# Patient Record
Sex: Male | Born: 1990 | Race: Black or African American | Hispanic: No | Marital: Single
Health system: Southern US, Community
[De-identification: ages and names within clinical notes are randomized; demographics above are authoritative.]

## PROBLEM LIST (undated history)

## (undated) DIAGNOSIS — R011 Cardiac murmur, unspecified: Secondary | ICD-10-CM

## (undated) DIAGNOSIS — K259 Gastric ulcer, unspecified as acute or chronic, without hemorrhage or perforation: Secondary | ICD-10-CM

## (undated) DIAGNOSIS — I519 Heart disease, unspecified: Secondary | ICD-10-CM

---
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
## 2011-06-06 IMAGING — CR DG CHEST 1V PORT
1 series · 1 of 1 positions shown · non-contrast
Comparison: 10/04/2010

CLINICAL DATA: Follow-up pneumothorax

PORTABLE CHEST - 1 VIEW

[AP]
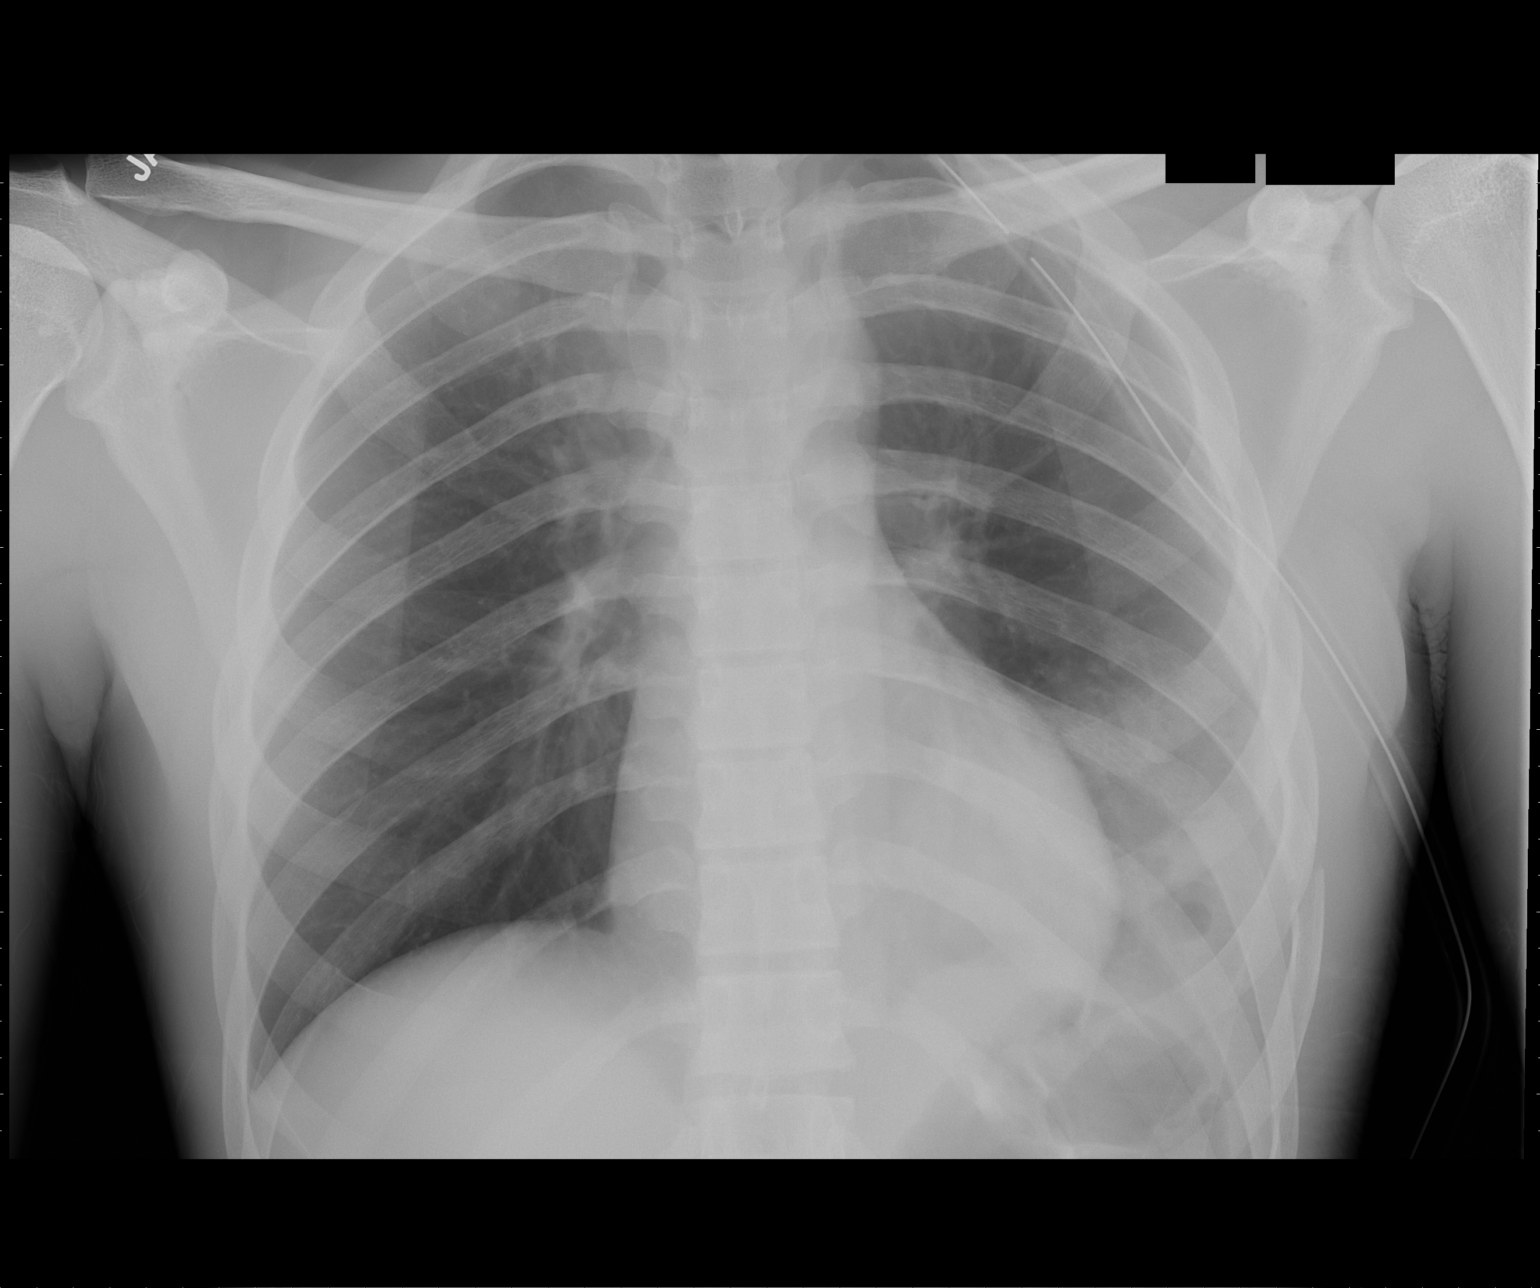

[1 of 1 positions shown; findings below may reference images not displayed]

FINDINGS: Left-sided chest tube remains in place and there is
slight interval enlargement of the pneumothorax now with 7 mm of
pleuroparenchymal displacement.  Persistent consolidation is seen
at the left lung base with developing lucencies suggesting
hematoceles.  The right lung is clear.  The heart is normal in
size.  Displaced left rib fractures are again seen.
IMPRESSION: Slight enlargement of the left-sided pneumothorax with chest tube
in place.  No other interval change.

## 2022-02-08 ENCOUNTER — Encounter (HOSPITAL_COMMUNITY): Payer: Self-pay

## 2022-02-08 ENCOUNTER — Other Ambulatory Visit: Payer: Self-pay

## 2022-02-08 ENCOUNTER — Emergency Department (HOSPITAL_COMMUNITY)
Admission: EM | Admit: 2022-02-08 | Discharge: 2022-02-08 | Disposition: A | Discharge status: Home / Self Care | Attending: Emergency Medicine | Admitting: Emergency Medicine

## 2022-02-08 DIAGNOSIS — R1013 Epigastric pain: Secondary | ICD-10-CM | POA: Diagnosis present

## 2022-02-08 DIAGNOSIS — K920 Hematemesis: Secondary | ICD-10-CM | POA: Diagnosis not present

## 2022-02-08 DIAGNOSIS — Z8711 Personal history of peptic ulcer disease: Secondary | ICD-10-CM

## 2022-02-08 HISTORY — DX: Cardiac murmur, unspecified: R01.1

## 2022-02-08 HISTORY — DX: Gastric ulcer, unspecified as acute or chronic, without hemorrhage or perforation: K25.9

## 2022-02-08 HISTORY — DX: Heart disease, unspecified: I51.9

## 2022-02-08 LAB — COMPREHENSIVE METABOLIC PANEL
ALT: 50 U/L — ABNORMAL HIGH (ref 0–44)
AST: 28 U/L (ref 15–41)
Albumin: 4.5 g/dL (ref 3.5–5.0)
Alkaline Phosphatase: 69 U/L (ref 38–126)
Anion gap: 5 (ref 5–15)
BUN: 11 mg/dL (ref 6–20)
CO2: 27 mmol/L (ref 22–32)
Calcium: 9.5 mg/dL (ref 8.9–10.3)
Chloride: 105 mmol/L (ref 98–111)
Creatinine, Ser: 1.09 mg/dL (ref 0.61–1.24)
GFR, Estimated: >60 mL/min (ref >60)
Glucose, Bld: 105 mg/dL — ABNORMAL HIGH (ref 70–99)
Potassium: 4.6 mmol/L (ref 3.5–5.1)
Sodium: 137 mmol/L (ref 135–145)
Total Bilirubin: 0.6 mg/dL (ref 0.3–1.2)
Total Protein: 8.2 g/dL — ABNORMAL HIGH (ref 6.5–8.1)

## 2022-02-08 LAB — CBC
HCT: 48.9 % (ref 39.0–52.0)
Hemoglobin: 15.7 g/dL (ref 13.0–17.0)
MCH: 25.9 pg — ABNORMAL LOW (ref 26.0–34.0)
MCHC: 32.1 g/dL (ref 30.0–36.0)
MCV: 80.7 fL (ref 80.0–100.0)
Platelets: 194 10*3/uL (ref 150–400)
RBC: 6.06 MIL/uL — ABNORMAL HIGH (ref 4.22–5.81)
RDW: 13.5 % (ref 11.5–15.5)
WBC: 5.1 10*3/uL (ref 4.0–10.5)
nRBC: 0 % (ref 0.0–0.2)

## 2022-02-08 LAB — LIPASE, BLOOD: Lipase: 20 U/L (ref 11–51)

## 2022-02-08 MED ORDER — ONDANSETRON 8 MG PO TBDP: ORAL_TABLET | ORAL | 0 refills | Status: AC

## 2022-02-08 MED ORDER — PANTOPRAZOLE SODIUM 40 MG PO TBEC
40.0000 mg | DELAYED_RELEASE_TABLET | Freq: Once | ORAL | Status: AC
  Administered 2022-02-08: 40 mg via ORAL
  Filled 2022-02-08: qty 1

## 2022-02-08 MED ORDER — ONDANSETRON 4 MG PO TBDP
4.0000 mg | ORAL_TABLET | Freq: Once | ORAL | Status: AC | PRN
Start: 2022-02-08 — End: 2022-02-08
  Administered 2022-02-08: 4 mg via ORAL
  Filled 2022-02-08: qty 1

## 2022-02-08 MED ORDER — SUCRALFATE 1 G PO TABS
1.0000 g | ORAL_TABLET | Freq: Three times a day (TID) | ORAL | Status: DC
  Administered 2022-02-08: 1 g via ORAL
  Filled 2022-02-08: qty 1

## 2022-02-08 MED ORDER — SUCRALFATE 1 G PO TABS: 1.0000 g | ORAL_TABLET | Freq: Three times a day (TID) | ORAL | 1 refills | Status: AC

## 2022-02-08 MED ORDER — PANTOPRAZOLE SODIUM 20 MG PO TBEC: 20.0000 mg | DELAYED_RELEASE_TABLET | Freq: Two times a day (BID) | ORAL | 1 refills | Status: AC

## 2022-02-08 MED ORDER — ONDANSETRON 8 MG PO TBDP
8.0000 mg | ORAL_TABLET | Freq: Once | ORAL | Status: AC
  Administered 2022-02-08: 8 mg via ORAL
  Filled 2022-02-08: qty 1

## 2022-02-08 NOTE — Discharge Instructions (Signed)
Begin taking Carafate and Protonix as prescribed.  Begin taking Zofran as prescribed as needed for nausea.  Clear liquid diet for the next 12 hours, then slowly advance to normal as tolerated.  Return to the emergency department if you develop severe abdominal pain, high fevers, black or bloody stools, or for other new and concerning symptoms.

## 2022-02-08 NOTE — ED Triage Notes (Signed)
Pt presents with epigastric abd pain, N/V. Pt reports streaks of blood in his emesis. Pt states he has a hx of stomach ulcers.

## 2022-02-08 NOTE — ED Provider Notes (Signed)
[facility] EMERGENCY DEPARTMENT Provider Note   CSN: [id number] Arrival date & time: 02/08/22  2101     History  Chief Complaint  Patient presents with   Abdominal Pain    Thomas Lin is a 31 y.o. male.  Patient is a 31 year old male with past medical history of peptic ulcers off medications for the past 6 months since being incarcerated.  Patient presenting today with complaints of upper abdominal pain, nausea, and several episodes of vomiting since yesterday evening.  This evening when he vomited, there was a small amount of blood in the emesis.  He denies having any fevers or chills.  He denies any diarrhea, constipation, or bloody or black stools.  He denies any ill contacts or having consumed any undercooked or suspicious foods.  The history is provided by the patient.  Abdominal Pain Pain location:  Epigastric Pain quality: cramping   Pain radiates to:  Does not radiate Pain severity:  Moderate Onset quality:  Gradual Duration:  2 days Timing:  Constant Progression:  Worsening Chronicity:  New Relieved by:  Nothing Worsened by:  Nothing Ineffective treatments:  None tried     Home Medications Prior to Admission medications   Not on File      Allergies    Amoxicillin and Penicillins    Review of Systems   Review of Systems  Gastrointestinal:  Positive for abdominal pain.  All other systems reviewed and are negative.  Physical Exam Updated Vital Signs BP 116/70   Pulse (!) 53   Temp 97.7 F (36.5 C) (Oral)   Resp 18   Ht 5\' 11"  (1.803 m)   Wt 85.7 kg   SpO2 97%   BMI 26.36 kg/m  Physical Exam Vitals and nursing note reviewed.  Constitutional:      General: He is not in acute distress.    Appearance: He is well-developed. He is not diaphoretic.  HENT:     Head: Normocephalic and atraumatic.  Cardiovascular:     Rate and Rhythm: Normal rate and regular rhythm.     Heart sounds: No murmur heard.   No friction rub.  Pulmonary:     Effort:  Pulmonary effort is normal. No respiratory distress.     Breath sounds: Normal breath sounds. No wheezing or rales.  Abdominal:     General: Bowel sounds are normal. There is no distension.     Palpations: Abdomen is soft.     Tenderness: There is abdominal tenderness in the epigastric area. There is no right CVA tenderness, left CVA tenderness, guarding or rebound.  Musculoskeletal:        General: Normal range of motion.     Cervical back: Normal range of motion and neck supple.  Skin:    General: Skin is warm and dry.  Neurological:     Mental Status: He is alert and oriented to [place], place, and time.     Coordination: Coordination normal.    ED Results / Procedures / Treatments   Labs (all labs ordered are listed, but only abnormal results are displayed) Labs Reviewed  COMPREHENSIVE METABOLIC PANEL - Abnormal; Notable for the following components:      Result Value   Glucose, Bld 105 (*)    Total Protein 8.2 (*)    ALT 50 (*)    All other components within normal limits  CBC - Abnormal; Notable for the following components:   RBC 6.06 (*)    MCH 25.9 (*)    All other  components within normal limits  LIPASE, BLOOD  URINALYSIS, ROUTINE W REFLEX MICROSCOPIC    EKG None  Radiology No results found.  Procedures Procedures     Medications Ordered in ED Medications  pantoprazole (PROTONIX) EC tablet 40 mg (has no administration in time range)  sucralfate (CARAFATE) tablet 1 g (has no administration in time range)  ondansetron (ZOFRAN-ODT) disintegrating tablet 8 mg (has no administration in time range)  ondansetron (ZOFRAN-ODT) disintegrating tablet 4 mg (4 mg Oral Given 02/08/22 2148)    ED Course/ Medical Decision Making/ A&P  This patient presents to the ED for concern of abdominal pain and hematemesis, this involves an extensive number of treatment options, and is a complaint that carries with it a high risk of complications and morbidity.  The differential  diagnosis includes flareup of peptic ulcer disease, small bowel obstruction, gastroenteritis with Mallory-Weiss tear   Co morbidities that complicate the patient evaluation  None   Additional history obtained:  No additional history or external records needed   Lab Tests:  I Ordered, and personally interpreted labs.  The pertinent results include: Unremarkable CBC, specifically there is no leukocytosis and hemoglobin is 15.6.  Hepatic panel and electrolytes are unremarkable and lipase is normal.   Imaging Studies ordered:  No imaging studies ordered or indicated.   Cardiac Monitoring: / EKG:  None performed   Consultations Obtained:  No consultations obtained or indicated   Problem List / ED Course / Critical interventions / Medication management  Patient presenting here with complaints of vomiting that has turned bloody this evening.  He has history of peptic ulcers, but has been off his medications since being incarcerated 6 months ago.  I suspect patient either has a gastroenteritis with Mallory-Weiss tear or possibly flareup of peptic ulcers.  His abdominal exam reveals only mild tenderness to the epigastrium with no peritoneal signs.  He is not having any bloody stool or melena.  I do not feel as though any imaging studies are indicated.  Patient will be started on Protonix, Carafate, and given Zofran which he can take as needed for nausea. I ordered medication including Protonix and Carafate for ulcers/reflux Reevaluation of the patient after these medicines showed that the patient improved I have reviewed the patients home medicines and have made adjustments as needed   Social Determinants of Health:  Patient currently incarcerated   Test / Admission - Considered:  Patient to be discharged.  I will restart his Protonix and Carafate.  I will also give Zofran which he can take as needed.   Final Clinical Impression(s) / ED Diagnoses Final diagnoses:  None     Rx / DC Orders ED Discharge Orders     None         Veryl Speak, MD 02/08/22 2347
